# Patient Record
Sex: Male | Born: 1995 | Race: Black or African American | Hispanic: No | Smoking: Current every day smoker
Health system: Southern US, Community
[De-identification: ages and names within clinical notes are randomized; demographics above are authoritative.]

---
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
## 2016-05-10 ENCOUNTER — Encounter (HOSPITAL_COMMUNITY): Payer: Self-pay

## 2016-05-10 ENCOUNTER — Emergency Department (HOSPITAL_COMMUNITY)
Admission: EM | Admit: 2016-05-10 | Discharge: 2016-05-10 | Disposition: A | Discharge status: Home / Self Care | Payer: Medicaid Other | Attending: Dermatology | Admitting: Dermatology

## 2016-05-10 DIAGNOSIS — Z5321 Procedure and treatment not carried out due to patient leaving prior to being seen by health care provider: Secondary | ICD-10-CM | POA: Diagnosis not present

## 2016-05-10 DIAGNOSIS — R079 Chest pain, unspecified: Secondary | ICD-10-CM | POA: Diagnosis present

## 2016-05-10 NOTE — ED Triage Notes (Signed)
[facility] for intermittant chest pain when playing sports.  Last episode 1 week ago.

## 2016-05-10 NOTE — ED Notes (Signed)
Mom does not want to wait for son to be seen by MD.  Jeremy Johannech already done EKG.

## 2020-02-25 ENCOUNTER — Encounter (HOSPITAL_COMMUNITY): Payer: Self-pay

## 2020-02-25 ENCOUNTER — Emergency Department (HOSPITAL_COMMUNITY)
Admission: EM | Admit: 2020-02-25 | Discharge: 2020-02-25 | Disposition: A | Discharge status: Home / Self Care | Payer: Self-pay | Attending: Emergency Medicine | Admitting: Emergency Medicine

## 2020-02-25 ENCOUNTER — Other Ambulatory Visit: Payer: Self-pay

## 2020-02-25 DIAGNOSIS — S61412A Laceration without foreign body of left hand, initial encounter: Secondary | ICD-10-CM

## 2020-02-25 DIAGNOSIS — Z7722 Contact with and (suspected) exposure to environmental tobacco smoke (acute) (chronic): Secondary | ICD-10-CM | POA: Insufficient documentation

## 2020-02-25 DIAGNOSIS — S61422A Laceration with foreign body of left hand, initial encounter: Secondary | ICD-10-CM | POA: Insufficient documentation

## 2020-02-25 DIAGNOSIS — Z23 Encounter for immunization: Secondary | ICD-10-CM | POA: Insufficient documentation

## 2020-02-25 DIAGNOSIS — W260XXA Contact with knife, initial encounter: Secondary | ICD-10-CM | POA: Insufficient documentation

## 2020-02-25 MED ORDER — LIDOCAINE HCL (PF) 1 % IJ SOLN
5.0000 mL | Freq: Once | INTRAMUSCULAR | Status: AC
  Administered 2020-02-25: 5 mL
  Filled 2020-02-25: qty 5

## 2020-02-25 MED ORDER — TETANUS-DIPHTH-ACELL PERTUSSIS 5-2.5-18.5 LF-MCG/0.5 IM SUSY
0.5000 mL | PREFILLED_SYRINGE | Freq: Once | INTRAMUSCULAR | Status: AC
  Administered 2020-02-25: 0.5 mL via INTRAMUSCULAR
  Filled 2020-02-25: qty 0.5

## 2020-02-25 NOTE — ED Notes (Signed)
Reviewed discharge instructions with patient. Follow-up care reviewed. Patient verbalized understanding. Patient A&Ox4, VSS, and ambulatory with steady gait upon discharge.  

## 2020-02-25 NOTE — ED Provider Notes (Signed)
[facility] EMERGENCY DEPARTMENT Provider Note   CSN: [id number] Arrival date & time: 02/25/20  1634     History Chief Complaint  Patient presents with  . Extremity Laceration    Jeffery Reid is a 24 y.o. male.  Jeffery Reid is a 24 y.o. male with no significant PMHx who presents to the ED today with a 1cm laceration between his lefty thumb and 2nd digit. Mr. Besecker states that he was trying to open an object with a knife when it slipped and cut him. He was able to control the bleeding without issue. He does not know that date of his last tetanus shot. He denies any pain, paresthesias or loss of motor control of any of the digits of his left hand. He has no other injuries from the incident. Patient is right hand dominant.        History reviewed. No pertinent past medical history.  There are no problems to display for this patient.   History reviewed. No pertinent surgical history.     No family history on file.  Social History   Tobacco Use  . Smoking status: Passive Smoke Exposure - Never Smoker  . Smokeless tobacco: Never Used  Substance Use Topics  . Alcohol use: No  . Drug use: Not on file    Home Medications Prior to Admission medications   Not on File    Allergies    Patient has no known allergies.  Review of Systems   Review of Systems  Constitutional: Negative for fever.  Skin: Positive for wound.  Allergic/Immunologic: Negative for immunocompromised state.  Neurological: Negative for weakness and numbness.  Hematological: Does not bruise/bleed easily.  Psychiatric/Behavioral: Negative for suicidal ideas.    Physical Exam Updated Vital Signs BP 131/81 (BP Location: Left Arm)   Pulse 70   Temp 98.5 F (36.9 C) (Oral)   Resp 16   SpO2 100%   Physical Exam Vitals and nursing note reviewed.  Constitutional:      General: He is not in acute distress.    Appearance: He is well-developed. He is not diaphoretic.  HENT:       Head: Normocephalic and atraumatic.  Cardiovascular:     Pulses: Normal pulses.  Pulmonary:     Effort: Pulmonary effort is normal.  Musculoskeletal:        General: Tenderness present.     Comments: 1cm superficial laceration to web aspace between thumb and index finger. No active bleeding, ROM normal, sensation intact.  Skin:    General: Skin is warm and dry.     Capillary Refill: Capillary refill takes less than 2 seconds.     Findings: No erythema or rash.  Neurological:     Mental Status: He is alert and oriented to person, place, and time.     Sensory: No sensory deficit.     Motor: No weakness.  Psychiatric:        Behavior: Behavior normal.     ED Results / Procedures / Treatments   Labs (all labs ordered are listed, but only abnormal results are displayed) Labs Reviewed - No data to display  EKG None  Radiology No results found.  Procedures .Marland KitchenLaceration Repair  Date/Time: 02/25/2020 5:26 PM Performed by: Jeannie Fend, PA-C Authorized by: Jeannie Fend, PA-C   Consent:    Consent obtained:  Verbal   Consent given by:  Patient   Risks discussed:  Infection, need for additional repair, pain, poor cosmetic result and  poor wound healing   Alternatives discussed:  No treatment and delayed treatment Universal protocol:    Procedure explained and questions answered to patient or proxy's satisfaction: yes     Relevant documents present and verified: yes     Test results available and properly labeled: yes     Imaging studies available: yes     Required blood products, implants, devices, and special equipment available: yes     Site/side marked: yes     Immediately prior to procedure, a time out was called: yes     Patient identity confirmed:  Verbally with patient Anesthesia (see MAR for exact dosages):    Anesthesia method:  Local infiltration   Local anesthetic:  Lidocaine 1% w/o epi Laceration details:    Location:  Hand   Hand location:  L  palm   Length (cm):  1   Depth (mm):  2 Repair type:    Repair type:  Simple Pre-procedure details:    Preparation:  Patient was prepped and draped in usual sterile fashion Exploration:    Hemostasis achieved with:  Direct pressure   Wound exploration: wound explored through full range of motion and entire depth of wound probed and visualized     Wound extent: no foreign bodies/material noted, no muscle damage noted, no tendon damage noted, no underlying fracture noted and no vascular damage noted     Contaminated: no   Treatment:    Area cleansed with:  Saline   Amount of cleaning:  Standard   Irrigation solution:  Sterile saline Skin repair:    Repair method:  Sutures   Suture size:  4-0   Suture material:  Prolene   Suture technique:  Simple interrupted   Number of sutures:  2 Approximation:    Approximation:  Close Post-procedure details:    Dressing:  Adhesive bandage   Patient tolerance of procedure:  Tolerated well, no immediate complications   (including critical care time)  Medications Ordered in ED Medications  lidocaine (PF) (XYLOCAINE) 1 % injection 5 mL (5 mLs Infiltration Given by Other 02/25/20 1705)  Tdap (BOOSTRIX) injection 0.5 mL (0.5 mLs Intramuscular Given 02/25/20 1706)    ED Course  I have reviewed the triage vital signs and the nursing notes.  Pertinent labs & imaging results that were available during my care of the patient were reviewed by me and considered in my medical decision making (see chart for details).  Clinical Course as of Feb 25 1727  Tue Feb 25, 2020  1727 24 yo male with laceration to left hand. Superficial wound however due to location would benefit from closure with sutures. 2 sutures placed without difficulty. Td updated. Removal in 7 days advised.    [LM]    Clinical Course User Index [LM] Alden Hipp   MDM Rules/Calculators/A&P                          Final Clinical Impression(s) / ED Diagnoses Final  diagnoses:  Laceration of left hand, foreign body presence unspecified, initial encounter    Rx / DC Orders ED Discharge Orders    None       Jeannie Fend, PA-C 02/25/20 1728    Alvira Monday, MD 02/27/20 2204

## 2020-02-25 NOTE — Discharge Instructions (Addendum)
Recommend wound check in 2 days with your doctor. Suture removal in 7 days,.

## 2020-02-25 NOTE — ED Triage Notes (Signed)
Pt has small cut to left thumb from using a knife incorrectly. Bleeding controlled. Last TDAP unknown

## 2023-08-11 ENCOUNTER — Ambulatory Visit
Admission: EM | Admit: 2023-08-11 | Discharge: 2023-08-11 | Disposition: A | Discharge status: Home / Self Care | Attending: Family Medicine | Admitting: Family Medicine

## 2023-08-11 DIAGNOSIS — K047 Periapical abscess without sinus: Secondary | ICD-10-CM | POA: Diagnosis not present

## 2023-08-11 MED ORDER — AMOXICILLIN-POT CLAVULANATE 875-125 MG PO TABS: 1.0000 | ORAL_TABLET | Freq: Two times a day (BID) | ORAL | 0 refills | Status: AC

## 2023-08-11 NOTE — ED Provider Notes (Signed)
 [facility]    CSN: [id number] Arrival date & time: 08/11/23  1447      History   Chief Complaint Chief Complaint  Patient presents with   Dental Pain    HPI Fitzgerald Dunne is a 28 y.o. male.    Dental Pain  Patient is here for dental pain.  Has been going on x 6 months, getting worse.  Feels like it is slightly swollen.  Pain at the right upper and lower.  He also feels the neck in this neck, shoulders, head.  No fevers/chills.  No n/v.        History reviewed. No pertinent past medical history.  There are no active problems to display for this patient.   History reviewed. No pertinent surgical history.     Home Medications    Prior to Admission medications   Medication Sig Start Date End Date Taking? Authorizing Provider  ibuprofen (ADVIL) 400 MG tablet Take 400 mg by mouth every 6 (six) hours as needed.   Yes [provider]    Family History History reviewed. No pertinent family history.  Social History Social History   Tobacco Use   Smoking status: Every Day    Types: Cigars, Cigarettes    Passive exposure: Yes   Smokeless tobacco: Never  Vaping Use   Vaping status: Never Used  Substance Use Topics   Alcohol use: Not Currently    Comment: Occassionally.   Drug use: Not Currently     Allergies   Patient has no known allergies.   Review of Systems Review of Systems  Constitutional: Negative.   HENT:  Positive for dental problem.   Respiratory: Negative.    Cardiovascular: Negative.   Gastrointestinal: Negative.   Musculoskeletal: Negative.   Psychiatric/Behavioral: Negative.       Physical Exam Triage Vital Signs ED Triage Vitals  Encounter Vitals Group     BP 08/11/23 1501 119/70     Systolic BP Percentile --      Diastolic BP Percentile --      Pulse Rate 08/11/23 1501 66     Resp 08/11/23 1501 18     Temp 08/11/23 1501 97.6 F (36.4 C)     Temp Source 08/11/23 1501 Oral     SpO2 08/11/23 1501  98 %     Weight 08/11/23 1459 140 lb 6.9 oz (63.7 kg)     Height 08/11/23 1459 5\' 8"  (1.727 m)     Head Circumference --      Peak Flow --      Pain Score 08/11/23 1456 5     Pain Loc --      Pain Education --      Exclude from Growth Chart --    No data found.  Updated Vital Signs BP 119/70 (BP Location: Left Arm)   Pulse 66   Temp 97.6 F (36.4 C) (Oral)   Resp 18   Ht 5\' 8"  (1.727 m)   Wt 63.7 kg   SpO2 98%   BMI 21.35 kg/m   Visual Acuity Right Eye Distance:   Left Eye Distance:   Bilateral Distance:    Right Eye Near:   Left Eye Near:    Bilateral Near:     Physical Exam Constitutional:      General: He is not in acute distress.    Appearance: Normal appearance. He is normal weight. He is not ill-appearing or toxic-appearing.  HENT:     Mouth/Throat:  Comments: Broken/cracked teeth to the right lower molars;  broken tooth at the bottom left molars Cardiovascular:     Rate and Rhythm: Normal rate.  Pulmonary:     Effort: Pulmonary effort is normal.  Musculoskeletal:        General: No tenderness. Normal range of motion.  Skin:    General: Skin is warm.  Neurological:     General: No focal deficit present.     Mental Status: He is alert.  Psychiatric:        Mood and Affect: Mood normal.      UC Treatments / Results  Labs (all labs ordered are listed, but only abnormal results are displayed) Labs Reviewed - No data to display  EKG   Radiology No results found.  Procedures Procedures (including critical care time)  Medications Ordered in UC Medications - No data to display  Initial Impression / Assessment and Plan / UC Course  I have reviewed the triage vital signs and the nursing notes.  Pertinent labs & imaging results that were available during my care of the patient were reviewed by me and considered in my medical decision making (see chart for details).   Final Clinical Impressions(s) / UC Diagnoses   Final diagnoses:   Dental infection     Discharge Instructions      You were seen today for dental pain/infection.  I have sent out an oral antibiotic to take for infection. You may take motrin for pain as well.  I have given you a list of dental resources as well.  Please call for an appointment next week.     ED Prescriptions     Medication Sig Dispense Auth. Provider   amoxicillin-clavulanate (AUGMENTIN) 875-125 MG tablet Take 1 tablet by mouth every 12 (twelve) hours for 10 days. 20 tablet Jannifer Franklin, MD      PDMP not reviewed this encounter.   Jannifer Franklin, MD 08/11/23 [phone]

## 2023-08-11 NOTE — ED Triage Notes (Signed)
"  I am having tooth pain (upper right/back-upper and lower) teeth since last August but getting much worse recently". No fever known. "I have Medicaid and need a dental list/information".

## 2023-08-11 NOTE — Discharge Instructions (Signed)
 You were seen today for dental pain/infection.  I have sent out an oral antibiotic to take for infection. You may take motrin for pain as well.  I have given you a list of dental resources as well.  Please call for an appointment next week.
